# Patient Record
Sex: Male | Born: 1970 | Race: White | Hispanic: No | Marital: Married | Smoking: Never smoker
Health system: Southern US, Community
[De-identification: ages and names within clinical notes are randomized; demographics above are authoritative.]

## PROBLEM LIST (undated history)

## (undated) DIAGNOSIS — R9431 Abnormal electrocardiogram [ECG] [EKG]: Secondary | ICD-10-CM

## (undated) DIAGNOSIS — K219 Gastro-esophageal reflux disease without esophagitis: Secondary | ICD-10-CM

## (undated) DIAGNOSIS — I10 Essential (primary) hypertension: Secondary | ICD-10-CM

## (undated) DIAGNOSIS — R0789 Other chest pain: Secondary | ICD-10-CM

## (undated) HISTORY — PX: CHOLECYSTECTOMY: SHX55

## (undated) HISTORY — DX: Abnormal electrocardiogram (ECG) (EKG): R94.31

## (undated) HISTORY — PX: APPENDECTOMY: SHX54

## (undated) HISTORY — DX: Other chest pain: R07.89

## (undated) HISTORY — DX: Gastro-esophageal reflux disease without esophagitis: K21.9

---

## 2000-02-12 ENCOUNTER — Emergency Department (HOSPITAL_COMMUNITY): Admission: EM | Admit: 2000-02-12 | Discharge: 2000-02-12 | Payer: Self-pay | Admitting: Emergency Medicine

## 2000-04-23 ENCOUNTER — Encounter: Payer: Self-pay | Admitting: Family Medicine

## 2000-04-23 ENCOUNTER — Ambulatory Visit (HOSPITAL_COMMUNITY): Admission: RE | Admit: 2000-04-23 | Discharge: 2000-04-23 | Payer: Self-pay | Admitting: Family Medicine

## 2000-09-10 ENCOUNTER — Encounter: Payer: Self-pay | Admitting: Emergency Medicine

## 2000-09-10 ENCOUNTER — Emergency Department (HOSPITAL_COMMUNITY): Admission: EM | Admit: 2000-09-10 | Discharge: 2000-09-10 | Payer: Self-pay | Admitting: Emergency Medicine

## 2001-09-01 ENCOUNTER — Emergency Department (HOSPITAL_COMMUNITY): Admission: EM | Admit: 2001-09-01 | Discharge: 2001-09-01 | Payer: Self-pay | Admitting: Emergency Medicine

## 2001-09-01 ENCOUNTER — Encounter: Payer: Self-pay | Admitting: Emergency Medicine

## 2004-03-23 ENCOUNTER — Emergency Department (HOSPITAL_COMMUNITY): Admission: EM | Admit: 2004-03-23 | Discharge: 2004-03-24 | Payer: Self-pay | Admitting: Family Medicine

## 2004-11-18 ENCOUNTER — Emergency Department (HOSPITAL_COMMUNITY): Admission: EM | Admit: 2004-11-18 | Discharge: 2004-11-18 | Payer: Self-pay | Admitting: Family Medicine

## 2004-12-09 ENCOUNTER — Emergency Department (HOSPITAL_COMMUNITY): Admission: EM | Admit: 2004-12-09 | Discharge: 2004-12-09 | Payer: Self-pay | Admitting: Emergency Medicine

## 2005-04-10 ENCOUNTER — Emergency Department (HOSPITAL_COMMUNITY): Admission: EM | Admit: 2005-04-10 | Discharge: 2005-04-10 | Payer: Self-pay | Admitting: *Deleted

## 2005-04-15 ENCOUNTER — Emergency Department (HOSPITAL_COMMUNITY): Admission: EM | Admit: 2005-04-15 | Discharge: 2005-04-15 | Payer: Self-pay | Admitting: Emergency Medicine

## 2005-04-28 ENCOUNTER — Encounter (INDEPENDENT_AMBULATORY_CARE_PROVIDER_SITE_OTHER): Payer: Self-pay | Admitting: Specialist

## 2005-04-28 ENCOUNTER — Ambulatory Visit (HOSPITAL_COMMUNITY): Admission: RE | Admit: 2005-04-28 | Discharge: 2005-04-28 | Payer: Self-pay | Admitting: Gastroenterology

## 2006-04-26 ENCOUNTER — Emergency Department (HOSPITAL_COMMUNITY): Admission: EM | Admit: 2006-04-26 | Discharge: 2006-04-26 | Payer: Self-pay | Admitting: Emergency Medicine

## 2006-04-26 ENCOUNTER — Ambulatory Visit: Admission: RE | Admit: 2006-04-26 | Discharge: 2006-04-26 | Payer: Self-pay | Admitting: Family Medicine

## 2006-07-25 ENCOUNTER — Encounter: Admission: RE | Admit: 2006-07-25 | Discharge: 2006-07-25 | Payer: Self-pay | Admitting: Gastroenterology

## 2006-10-12 ENCOUNTER — Encounter: Admission: RE | Admit: 2006-10-12 | Discharge: 2006-10-12 | Payer: Self-pay | Admitting: Gastroenterology

## 2007-01-01 ENCOUNTER — Encounter: Admission: RE | Admit: 2007-01-01 | Discharge: 2007-01-01 | Payer: Self-pay | Admitting: Gastroenterology

## 2007-01-17 ENCOUNTER — Ambulatory Visit (HOSPITAL_COMMUNITY): Admission: RE | Admit: 2007-01-17 | Discharge: 2007-01-17 | Payer: Self-pay | Admitting: Gastroenterology

## 2007-01-23 ENCOUNTER — Encounter: Admission: RE | Admit: 2007-01-23 | Discharge: 2007-01-23 | Payer: Self-pay | Admitting: Gastroenterology

## 2007-03-04 ENCOUNTER — Emergency Department (HOSPITAL_COMMUNITY): Admission: EM | Admit: 2007-03-04 | Discharge: 2007-03-04 | Payer: Self-pay | Admitting: Family Medicine

## 2008-07-29 ENCOUNTER — Emergency Department (HOSPITAL_COMMUNITY): Admission: EM | Admit: 2008-07-29 | Discharge: 2008-07-29 | Payer: Self-pay | Admitting: Family Medicine

## 2008-11-13 ENCOUNTER — Emergency Department (HOSPITAL_COMMUNITY): Admission: EM | Admit: 2008-11-13 | Discharge: 2008-11-13 | Payer: Self-pay | Admitting: Family Medicine

## 2009-04-07 ENCOUNTER — Emergency Department (HOSPITAL_COMMUNITY): Admission: EM | Admit: 2009-04-07 | Discharge: 2009-04-08 | Payer: Self-pay | Admitting: Emergency Medicine

## 2009-04-07 ENCOUNTER — Emergency Department (HOSPITAL_COMMUNITY): Admission: EM | Admit: 2009-04-07 | Discharge: 2009-04-07 | Payer: Self-pay | Admitting: Family Medicine

## 2010-02-23 ENCOUNTER — Emergency Department (HOSPITAL_COMMUNITY): Admission: EM | Admit: 2010-02-23 | Discharge: 2010-02-23 | Payer: Self-pay | Admitting: Emergency Medicine

## 2010-07-18 LAB — POCT URINALYSIS DIP (DEVICE)
Hgb urine dipstick: NEGATIVE
Specific Gravity, Urine: 1.02 (ref 1.005–1.030)
Urobilinogen, UA: 2 mg/dL — ABNORMAL HIGH (ref 0.0–1.0)

## 2010-07-24 LAB — URINE CULTURE: Culture: NO GROWTH

## 2010-07-24 LAB — POCT URINALYSIS DIP (DEVICE)
Bilirubin Urine: NEGATIVE
Protein, ur: NEGATIVE mg/dL
Specific Gravity, Urine: 1.025 (ref 1.005–1.030)
Urobilinogen, UA: 0.2 mg/dL (ref 0.0–1.0)
pH: 6 (ref 5.0–8.0)

## 2011-01-12 ENCOUNTER — Inpatient Hospital Stay (INDEPENDENT_AMBULATORY_CARE_PROVIDER_SITE_OTHER)
Admission: RE | Admit: 2011-01-12 | Discharge: 2011-01-12 | Disposition: A | Discharge status: Home / Self Care | Payer: Managed Care, Other (non HMO) | Source: Ambulatory Visit | Attending: Family Medicine | Admitting: Family Medicine

## 2011-01-12 DIAGNOSIS — M549 Dorsalgia, unspecified: Secondary | ICD-10-CM

## 2011-01-24 LAB — POCT URINALYSIS DIP (DEVICE)
Bilirubin Urine: NEGATIVE
Glucose, UA: NEGATIVE
Hgb urine dipstick: NEGATIVE
Ketones, ur: NEGATIVE
Ketones, ur: NEGATIVE
Leukocytes, UA: NEGATIVE
Nitrite: NEGATIVE
Nitrite: NEGATIVE
Urobilinogen, UA: 0.2
pH: 7

## 2011-01-24 LAB — CBC
HCT: 44.4
Hemoglobin: 15.2
MCHC: 34.1
MCV: 84.9
RBC: 5.23
WBC: 6

## 2011-01-24 LAB — DIFFERENTIAL
Basophils Absolute: 0
Basophils Relative: 0
Eosinophils Absolute: 0.1 — ABNORMAL LOW
Eosinophils Relative: 2
Monocytes Absolute: 0.6

## 2011-01-24 LAB — I-STAT 8, (EC8 V) (CONVERTED LAB)
BUN: 13
Bicarbonate: 30.3 — ABNORMAL HIGH
HCT: 47
Hemoglobin: 16
Operator id: 235561
Sodium: 139
TCO2: 32

## 2011-01-24 LAB — POCT I-STAT CREATININE: Creatinine, Ser: 0.9

## 2014-08-30 ENCOUNTER — Encounter (HOSPITAL_COMMUNITY): Payer: Self-pay | Admitting: *Deleted

## 2014-08-30 ENCOUNTER — Emergency Department (HOSPITAL_COMMUNITY)
Admission: EM | Admit: 2014-08-30 | Discharge: 2014-08-30 | Disposition: A | Discharge status: Home / Self Care | Payer: BLUE CROSS/BLUE SHIELD | Source: Home / Self Care | Attending: Family Medicine | Admitting: Family Medicine

## 2014-08-30 DIAGNOSIS — S76312A Strain of muscle, fascia and tendon of the posterior muscle group at thigh level, left thigh, initial encounter: Secondary | ICD-10-CM | POA: Diagnosis not present

## 2014-08-30 MED ORDER — CYCLOBENZAPRINE HCL 5 MG PO TABS: 5.0000 mg | ORAL_TABLET | Freq: Three times a day (TID) | ORAL | Status: DC | PRN

## 2014-08-30 MED ORDER — DICLOFENAC POTASSIUM 50 MG PO TABS: 50.0000 mg | ORAL_TABLET | Freq: Three times a day (TID) | ORAL | Status: DC

## 2014-08-30 NOTE — Discharge Instructions (Signed)
Use heat, ace wrap and medicine as prescribed and see orthopedist if further problems.

## 2014-08-30 NOTE — ED Notes (Signed)
Pt  Reports  Pain  Behind       l  Leg   Worse   On  Movement and   posistion  denys  specefic  Injury   however pin occurred  While  Walking  in  [employer]

## 2014-08-30 NOTE — ED Provider Notes (Signed)
CSN: [id number]     Arrival date & time 08/30/14  1552 History   First MD Initiated Contact with Patient 08/30/14 1607     Chief Complaint  Patient presents with  . Leg Pain   (Consider location/radiation/quality/duration/timing/severity/associated sxs/prior Treatment) Patient is a 44 y.o. male presenting with leg pain. The history is provided by the patient and the spouse.  Leg Pain Location:  Buttock Time since incident:  2 days Injury: no   Buttock location:  L buttock Pain details:    Quality:  Cramping and sharp   Radiates to:  Does not radiate   Severity:  Mild   Onset quality:  Sudden (walking in [employer] when started.)   Progression:  Unchanged Chronicity:  New Dislocation: no   Prior injury to area:  No Relieved by:  None tried Ineffective treatments:  Acetaminophen and NSAIDs Associated symptoms: no back pain and no fever     History reviewed. No pertinent past medical history. History reviewed. No pertinent past surgical history. History reviewed. No pertinent family history. History  Substance Use Topics  . Smoking status: Never Smoker   . Smokeless tobacco: Not on file  . Alcohol Use: No    Review of Systems  Constitutional: Negative.  Negative for fever.  Gastrointestinal: Negative.   Genitourinary: Negative.   Musculoskeletal: Positive for myalgias. Negative for back pain and gait problem.  Skin: Negative.     Allergies  Review of patient's allergies indicates no known allergies.  Home Medications   Prior to Admission medications   Medication Sig Start Date End Date Taking? Authorizing Provider  cyclobenzaprine (FLEXERIL) 5 MG tablet Take 1 tablet (5 mg total) by mouth 3 (three) times daily as needed for muscle spasms. 08/30/14   Linna HoffJames D Kindl, MD  diclofenac (CATAFLAM) 50 MG tablet Take 1 tablet (50 mg total) by mouth 3 (three) times daily. For leg pain 08/30/14   Linna HoffJames D Kindl, MD   BP 143/85 mmHg  Pulse 111  Temp(Src) 99 F (37.2 C) (Oral)   Resp 16  SpO2 97% Physical Exam  Constitutional: He is oriented to person, place, and time. He appears well-developed and well-nourished.  Musculoskeletal: He exhibits tenderness.       Left upper leg: He exhibits tenderness. He exhibits no bony tenderness, no swelling, no edema and no deformity.       Legs: Neurological: He is alert and oriented to person, place, and time.  Skin: Skin is warm and dry.  Nursing note and vitals reviewed.   ED Course  Procedures (including critical care time) Labs Review Labs Reviewed - No data to display  Imaging Review No results found.   MDM   1. Hamstring muscle strain, left, initial encounter        Linna HoffJames D Kindl, MD 08/30/14 1625

## 2015-07-09 ENCOUNTER — Other Ambulatory Visit: Payer: Self-pay | Admitting: Family Medicine

## 2015-07-09 DIAGNOSIS — R2242 Localized swelling, mass and lump, left lower limb: Secondary | ICD-10-CM

## 2015-07-14 ENCOUNTER — Ambulatory Visit
Admission: RE | Admit: 2015-07-14 | Discharge: 2015-07-14 | Disposition: A | Discharge status: Home / Self Care | Payer: BLUE CROSS/BLUE SHIELD | Source: Ambulatory Visit | Attending: Family Medicine | Admitting: Family Medicine

## 2015-07-14 DIAGNOSIS — R2242 Localized swelling, mass and lump, left lower limb: Secondary | ICD-10-CM

## 2016-07-10 DIAGNOSIS — M5412 Radiculopathy, cervical region: Secondary | ICD-10-CM | POA: Diagnosis not present

## 2016-07-10 DIAGNOSIS — Z13220 Encounter for screening for lipoid disorders: Secondary | ICD-10-CM | POA: Diagnosis not present

## 2016-07-10 DIAGNOSIS — R030 Elevated blood-pressure reading, without diagnosis of hypertension: Secondary | ICD-10-CM | POA: Diagnosis not present

## 2016-07-10 DIAGNOSIS — Z0000 Encounter for general adult medical examination without abnormal findings: Secondary | ICD-10-CM | POA: Diagnosis not present

## 2016-07-10 DIAGNOSIS — L818 Other specified disorders of pigmentation: Secondary | ICD-10-CM | POA: Diagnosis not present

## 2016-07-17 DIAGNOSIS — Z135 Encounter for screening for eye and ear disorders: Secondary | ICD-10-CM | POA: Diagnosis not present

## 2016-09-21 DIAGNOSIS — J029 Acute pharyngitis, unspecified: Secondary | ICD-10-CM | POA: Diagnosis not present

## 2016-09-21 DIAGNOSIS — I10 Essential (primary) hypertension: Secondary | ICD-10-CM | POA: Diagnosis not present

## 2016-10-02 DIAGNOSIS — M5412 Radiculopathy, cervical region: Secondary | ICD-10-CM | POA: Diagnosis not present

## 2016-10-02 DIAGNOSIS — R030 Elevated blood-pressure reading, without diagnosis of hypertension: Secondary | ICD-10-CM | POA: Diagnosis not present

## 2016-10-02 DIAGNOSIS — E669 Obesity, unspecified: Secondary | ICD-10-CM | POA: Diagnosis not present

## 2016-10-02 DIAGNOSIS — L818 Other specified disorders of pigmentation: Secondary | ICD-10-CM | POA: Diagnosis not present

## 2016-11-13 DIAGNOSIS — R05 Cough: Secondary | ICD-10-CM | POA: Diagnosis not present

## 2016-11-13 DIAGNOSIS — M5412 Radiculopathy, cervical region: Secondary | ICD-10-CM | POA: Diagnosis not present

## 2016-11-13 DIAGNOSIS — R030 Elevated blood-pressure reading, without diagnosis of hypertension: Secondary | ICD-10-CM | POA: Diagnosis not present

## 2017-01-12 DIAGNOSIS — I10 Essential (primary) hypertension: Secondary | ICD-10-CM | POA: Diagnosis not present

## 2017-01-12 DIAGNOSIS — R030 Elevated blood-pressure reading, without diagnosis of hypertension: Secondary | ICD-10-CM | POA: Diagnosis not present

## 2017-01-12 DIAGNOSIS — M5412 Radiculopathy, cervical region: Secondary | ICD-10-CM | POA: Diagnosis not present

## 2017-04-27 ENCOUNTER — Emergency Department
Admission: EM | Admit: 2017-04-27 | Discharge: 2017-04-28 | Disposition: A | Discharge status: Home / Self Care | Payer: BLUE CROSS/BLUE SHIELD | Attending: Emergency Medicine | Admitting: Emergency Medicine

## 2017-04-27 ENCOUNTER — Other Ambulatory Visit: Payer: Self-pay

## 2017-04-27 ENCOUNTER — Emergency Department: Payer: BLUE CROSS/BLUE SHIELD

## 2017-04-27 ENCOUNTER — Encounter: Payer: Self-pay | Admitting: Emergency Medicine

## 2017-04-27 DIAGNOSIS — R079 Chest pain, unspecified: Secondary | ICD-10-CM | POA: Diagnosis not present

## 2017-04-27 DIAGNOSIS — I10 Essential (primary) hypertension: Secondary | ICD-10-CM | POA: Diagnosis not present

## 2017-04-27 HISTORY — DX: Essential (primary) hypertension: I10

## 2017-04-27 LAB — BASIC METABOLIC PANEL
ANION GAP: 11 (ref 5–15)
BUN: 15 mg/dL (ref 6–20)
CO2: 25 mmol/L (ref 22–32)
Calcium: 9.2 mg/dL (ref 8.9–10.3)
Chloride: 103 mmol/L (ref 101–111)
Creatinine, Ser: 0.84 mg/dL (ref 0.61–1.24)
GFR calc Af Amer: >60 mL/min (ref >60)
GFR calc non Af Amer: >60 mL/min (ref >60)
Glucose, Bld: 106 mg/dL — ABNORMAL HIGH (ref 65–99)
POTASSIUM: 4.3 mmol/L (ref 3.5–5.1)
SODIUM: 139 mmol/L (ref 135–145)

## 2017-04-27 LAB — CBC
HEMATOCRIT: 45.6 % (ref 40.0–52.0)
Hemoglobin: 15.1 g/dL (ref 13.0–18.0)
MCH: 28.5 pg (ref 26.0–34.0)
MCHC: 33.2 g/dL (ref 32.0–36.0)
MCV: 85.8 fL (ref 80.0–100.0)
Platelets: 241 10*3/uL (ref 150–440)
RBC: 5.31 MIL/uL (ref 4.40–5.90)
RDW: 14 % (ref 11.5–14.5)
WBC: 8.6 10*3/uL (ref 3.8–10.6)

## 2017-04-27 LAB — TROPONIN I: Troponin I: <0.03 ng/mL (ref <0.03)

## 2017-04-27 NOTE — ED Triage Notes (Signed)
Pt arrives ambulatory to triage with c/o chest pain x 2 weeks. Pt was seen at [facility] where he was informed that he was possibly having a HA or a stroke due to his 150/110 BP. Pt is in NAD.

## 2017-04-28 LAB — TROPONIN I: Troponin I: <0.03 ng/mL (ref <0.03)

## 2017-04-28 MED ORDER — KETOROLAC TROMETHAMINE 60 MG/2ML IM SOLN
60.0000 mg | Freq: Once | INTRAMUSCULAR | Status: DC
  Filled 2017-04-28: qty 2

## 2017-04-28 NOTE — ED Provider Notes (Signed)
[facility] Emergency Department Provider Note   ____________________________________________   First MD Initiated Contact with Patient 04/27/17 2355     (approximate)  I have reviewed the triage vital signs and the nursing notes.   HISTORY  Chief Complaint Chest Pain    HPI Ryan Hendricks is a 47 y.o. male who comes into the hospital today with chest pain.  The patient states that it started a couple of weeks ago.  He went to the minute clinic today because he had a cough for a couple of days and they told him that his blood pressure was high and they could not do an EKG so he was sent here.  The patient's blood pressure was 155/110.  He takes blood pressure medicines but he is unsure of the name.  He last saw his doctor about 2 months ago and had his medications changed.  He reports that the pain is in his upper chest across his entire chest.  He rates it about a 3 out of 10 and it comes and goes.  He denies any radiation of the pain although his wife states that he squeezes his hand a lot and she is unsure if that is related.  He states that he always has back pain but denies any shortness of breath dizziness or lightheadedness.  The patient is also not had any nausea or vomiting.  Nothing seems to make the pain better or worse.  The patient is here today for evaluation.  He has not taken anything for pain at home.   Past Medical History:  Diagnosis Date  . Hypertension     There are no active problems to display for this patient.   Past Surgical History:  Procedure Laterality Date  . APPENDECTOMY    . CHOLECYSTECTOMY      Prior to Admission medications   Medication Sig Start Date End Date Taking? Authorizing Provider  cyclobenzaprine (FLEXERIL) 5 MG tablet Take 1 tablet (5 mg total) by mouth 3 (three) times daily as needed for muscle spasms. 08/30/14   Linna Hoff, MD  diclofenac (CATAFLAM) 50 MG tablet Take 1 tablet (50 mg total) by mouth 3  (three) times daily. For leg pain 08/30/14   Linna Hoff, MD    Allergies Patient has no known allergies.  No family history on file.  Social History Social History   Tobacco Use  . Smoking status: Never Smoker  . Smokeless tobacco: Current User    Types: Chew  Substance Use Topics  . Alcohol use: Yes    Comment: occasionally  . Drug use: No    Review of Systems  Constitutional: No fever/chills Eyes: No visual changes. ENT: No sore throat. Cardiovascular: chest pain. Respiratory: Denies shortness of breath. Gastrointestinal: No abdominal pain.  No nausea, no vomiting.  No diarrhea.  No constipation. Genitourinary: Negative for dysuria. Musculoskeletal: Negative for back pain. Skin: Negative for rash. Neurological: Negative for headaches, focal weakness or numbness.   ____________________________________________   PHYSICAL EXAM:  VITAL SIGNS: ED Triage Vitals  Enc Vitals Group     BP 04/27/17 1949 (!) 147/87     Pulse Rate 04/27/17 1949 89     Resp 04/27/17 1949 18     Temp 04/27/17 1949 98.6 F (37 C)     Temp Source 04/27/17 1949 Oral     SpO2 04/27/17 1949 100 %     Weight 04/27/17 1950 238 lb (108 kg)     Height  04/27/17 1950 5\' 11"  (1.803 m)     Head Circumference --      Peak Flow --      Pain Score 04/27/17 1954 3     Pain Loc --      Pain Edu? --      Excl. in GC? --     Constitutional: Alert and oriented. Well appearing and in mild distress. Eyes: Conjunctivae are normal. PERRL. EOMI. Head: Atraumatic. Nose: No congestion/rhinnorhea. Mouth/Throat: Mucous membranes are moist.  Oropharynx non-erythematous. Cardiovascular: Normal rate, regular rhythm. Grossly normal heart sounds.  Good peripheral circulation. Respiratory: Normal respiratory effort.  No retractions. Lungs CTAB.  Mild upper chest tenderness to palpation Gastrointestinal: Soft and nontender. No distention.  Positive bowel sounds Musculoskeletal: No lower extremity tenderness nor  edema.   Neurologic:  Normal speech and language.  Skin:  Skin is warm, dry and intact. Psychiatric: Mood and affect are normal.   ____________________________________________   LABS (all labs ordered are listed, but only abnormal results are displayed)  Labs Reviewed  BASIC METABOLIC PANEL - Abnormal; Notable for the following components:      Result Value   Glucose, Bld 106 (*)    All other components within normal limits  CBC  TROPONIN I  TROPONIN I   ____________________________________________  EKG  ED ECG REPORT I, Rebecka Apley, the attending physician, personally viewed and interpreted this ECG.   Date: 04/27/2017  EKG Time: 1947  Rate: 86  Rhythm: normal sinus rhythm with lots of artifact  Axis: normal  Intervals:none  ST&T Change: none  ED ECG REPORT #2 I, Rebecka Apley, the attending physician, personally viewed and interpreted this ECG.   Date: 04/28/2017  EKG Time: 0036  Rate: 72  Rhythm: normal sinus rhythm  Axis: normal  Intervals:none  ST&T Change: none   ____________________________________________  RADIOLOGY  Dg Chest 2 View  Result Date: 04/27/2017 CLINICAL DATA:  c/o chest pain x 2 weeks. Pt was [facility] at [facility] where he was informed that he was possibly having a HA or a stroke due to his 150/110 BP. EXAM: CHEST  2 VIEW COMPARISON:  11/13/2008 FINDINGS: Mild to moderate thoracic spondylosis. Midline trachea. Normal heart size and mediastinal contours. No pleural effusion or pneumothorax. Clear lungs. IMPRESSION: No active cardiopulmonary disease. Electronically Signed   By: Jeronimo Greaves M.D.   On: 04/27/2017 20:16    ____________________________________________   PROCEDURES  Procedure(s) performed: None  Procedures  Critical Care performed: No  ____________________________________________   INITIAL IMPRESSION / ASSESSMENT AND PLAN / ED COURSE  As part of my medical decision making, I reviewed the  following data within the electronic MEDICAL RECORD NUMBER Notes from prior ED visits and [place] Controlled Substance Database   This is a 47 year old male who comes into the hospital today with some chest pain as well as a cough and elevated blood pressure.  My differential diagnosis includes hypertensive urgency, acute coronary syndrome, musculoskeletal pain, costochondritis.  We did check some blood work on the patient including 2 troponins which were negative.  We also checked a chest x-ray on the patient which was also negative.  I ordered a dose of Toradol for the patient's pain but he said that he did not have any pain and refused it.  The patient's blood pressure was also evaluated in the emergency department and it was 127/75 prior to his discharge.  Since the patient does not have any pain at this time his blood  pressure is improved I feel it is appropriate for the patient to be discharged home.  He should follow-up with his primary care physician and likely will need a stress test for further cardiac evaluation.  Otherwise the patient will be discharged home.      ____________________________________________   FINAL CLINICAL IMPRESSION(S) / ED DIAGNOSES  Final diagnoses:  Chest pain, unspecified type  Hypertension, unspecified type     ED Discharge Orders    None       Note:  This document was prepared using Dragon voice recognition software and may include unintentional dictation errors.    Rebecka ApleyWebster, Allison P, MD 04/28/17 [id number]

## 2017-04-28 NOTE — Discharge Instructions (Signed)
Please follow up with your primary care physician for further evaluation of your chest pain and further management of your blood pressure.

## 2017-05-04 DIAGNOSIS — K210 Gastro-esophageal reflux disease with esophagitis: Secondary | ICD-10-CM | POA: Diagnosis not present

## 2017-05-04 DIAGNOSIS — I10 Essential (primary) hypertension: Secondary | ICD-10-CM | POA: Diagnosis not present

## 2017-05-04 DIAGNOSIS — R0789 Other chest pain: Secondary | ICD-10-CM | POA: Diagnosis not present

## 2017-05-16 ENCOUNTER — Encounter: Payer: Self-pay | Admitting: Interventional Cardiology

## 2017-05-16 ENCOUNTER — Ambulatory Visit (INDEPENDENT_AMBULATORY_CARE_PROVIDER_SITE_OTHER): Payer: BLUE CROSS/BLUE SHIELD | Admitting: Interventional Cardiology

## 2017-05-16 VITALS — BP 140/76 | HR 99 | Ht 72.0 in | Wt 245.4 lb

## 2017-05-16 DIAGNOSIS — R079 Chest pain, unspecified: Secondary | ICD-10-CM

## 2017-05-16 DIAGNOSIS — Z720 Tobacco use: Secondary | ICD-10-CM | POA: Insufficient documentation

## 2017-05-16 DIAGNOSIS — I10 Essential (primary) hypertension: Secondary | ICD-10-CM | POA: Diagnosis not present

## 2017-05-16 NOTE — Progress Notes (Signed)
Cardiology Office Note   Date:  05/16/2017   ID:  Ryan DibbleLarry R Hendricks, DOB 1970/07/28, MRN [id number]  PCP:  Patient, No Pcp Per    No chief complaint on file. chest pain   Wt Readings from Last 3 Encounters:  05/16/17 245 lb 6.4 oz (111.3 kg)  04/27/17 238 lb (108 kg)       History of Present Illness: Ryan Hendricks is a 47 y.o. male who is being seen today for the evaluation of HTN and chest pain at the request of , Dr. Orvan JulyFrances Wong.  A few weeks ago, he had some sharp pain in his chest pain and heart burn feeling.  He went to Urgent care and then to the ER.  He had a negative w/u.  He is here for f/u.  He has had HTN for a year.  Meds have been adjusted and BP has been fairly well controlled. Hoe readings are in the 130-140 systolic range.  Currently on amlodipine, but he dies not remember what else he was on.   He exercises at the gym- treadmill and bike.  No chest pain with exercise.  He does break a sweat and tries to get his hR elevated.  He has some chest pain daily, lasting just a few seconds at a time.    He uses chewing tobacco.    He has had issues with the discs in his neck causing pain.    His job is sedentary.        Past Medical History:  Diagnosis Date  . Abnormal EKG   . Chest pain, atypical   . GERD (gastroesophageal reflux disease)   . Hypertension     Past Surgical History:  Procedure Laterality Date  . APPENDECTOMY    . CHOLECYSTECTOMY       Current Outpatient Medications  Medication Sig Dispense Refill  . amLODipine (NORVASC) 5 MG tablet Take 1 tablet by mouth daily.  4  . pantoprazole (PROTONIX) 40 MG tablet Take 1 tablet by mouth daily.  0   No current facility-administered medications for this visit.     Allergies:   Patient has no known allergies.    Social History:  The patient  reports that  has never smoked. His smokeless tobacco use includes chew. He reports that he drinks alcohol. He reports that he does not use drugs.    Family History:  The patient's family history includes Cancer in his mother; Diabetes in his father; Healthy in his daughter, sister, and sister.    ROS:  Please see the history of present illness.   Otherwise, review of systems are positive for chest pain.   All other systems are reviewed and negative.    PHYSICAL EXAM: VS:  BP 140/76 (BP Location: Left Arm, Patient Position: Sitting, Cuff Size: Large)   Pulse 99   Ht 6' (1.829 m)   Wt 245 lb 6.4 oz (111.3 kg)   SpO2 95%   BMI 33.28 kg/m  , BMI Body mass index is 33.28 kg/m. GEN: Well nourished, well developed, in no acute distress  HEENT: normal  Neck: no JVD, carotid bruits, or masses Cardiac: RRR; no murmurs, rubs, or gallops,no edema  Respiratory:  clear to auscultation bilaterally, normal work of breathing GI: soft, nontender, nondistended, + BS MS: no deformity or atrophy  Skin: warm and dry, no rash Neuro:  Strength and sensation are intact Psych: euthymic mood, full affect   EKG:   The ekg ordered today  demonstrates Normal sinus rhythm, no ST changes   Recent Labs: 04/27/2017: BUN 15; Creatinine, Ser 0.84; Hemoglobin 15.1; Platelets 241; Potassium 4.3; Sodium 139   Lipid Panel No results found for: CHOL, TRIG, HDL, CHOLHDL, VLDL, LDLCALC, LDLDIRECT   Other studies Reviewed: Additional studies/ records that were reviewed today with results demonstrating: LDL 96 in March 2018, HDL 38, cholesterol 158, triglycerides 120.   ASSESSMENT AND PLAN:  1. Chest pain: atypical.  I doubt his chest discomfort is coming from ischemic coronary artery disease.  Given his risk factors including family history of heart disease, will plan for exercise treadmill test.  I suspect he will do fine on this examination. 2. Tobacco abuse: I encouraged him to stop chewing tobacco. 3. Hypertension: Borderline blood pressure control today.  If blood pressure readings remain elevated despite weight loss and increased exercise, could  increase amlodipine to 10 mg daily.   Current medicines are reviewed at length with the patient today.  The patient concerns regarding his medicines were addressed.  The following changes have been made:  No change  Labs/ tests ordered today include:  No orders of the defined types were placed in this encounter.   Recommend 150 minutes/week of aerobic exercise Low fat, low carb, high fiber diet recommended  Disposition:   FU for stress test   Signed, Lance Muss, MD  05/16/2017 4:07 PM    [facility] [address] [place], [place], [place]  [address] Phone: [phone]; Fax: [phone]

## 2017-05-16 NOTE — Patient Instructions (Signed)
Medication Instructions:  Your physician recommends that you continue on your current medications as directed. Please refer to the Current Medication list given to you today.   Labwork: None ordered  Testing/Procedures: Your physician has requested that you have an exercise tolerance test. For further information please visit [email or web address]. Please also follow instruction sheet, as given.  Follow-Up: Your physician wants you to follow-up AS NEEDED   Any Other Special Instructions Will Be Listed Below (If Applicable).    Exercise Stress Electrocardiogram An exercise stress electrocardiogram is a test to check how blood flows to your heart. It is done to find areas of poor blood flow. You will need to walk on a treadmill for this test. The electrocardiogram will record your heartbeat when you are at rest and when you are exercising. What happens before the procedure?  Do not have drinks with caffeine or foods with caffeine for 24 hours before the test, or as told by your doctor. This includes coffee, tea (even decaf tea), sodas, chocolate, and cocoa.  Follow your doctor's instructions about eating and drinking before the test.  Ask your doctor what medicines you should or should not take before the test. Take your medicines with water unless told by your doctor not to.  If you use an inhaler, bring it with you to the test.  Bring a snack to eat after the test.  Do not  smoke for 4 hours before the test.  Do not put lotions, powders, creams, or oils on your chest before the test.  Wear comfortable shoes and clothing. What happens during the procedure?  You will have patches put on your chest. Small areas of your chest may need to be shaved. Wires will be connected to the patches.  Your heart rate will be watched while you are resting and while you are exercising.  You will walk on the treadmill. The treadmill will slowly get faster to raise your heart rate.  The test  will take about 1-2 hours. What happens after the procedure?  Your heart rate and blood pressure will be watched after the test.  You may return to your normal diet, activities, and medicines or as told by your doctor. This information is not intended to replace advice given to you by your health care provider. Make sure you discuss any questions you have with your health care provider. Document Released: 09/20/2007 Document Revised: 12/01/2015 Document Reviewed: 12/09/2012 Elsevier Interactive Patient Education  [employer]2018 [employer]   If you need a refill on your cardiac medications before your next appointment, please call your pharmacy.

## 2017-05-28 ENCOUNTER — Other Ambulatory Visit: Payer: Self-pay | Admitting: Family Medicine

## 2017-05-28 ENCOUNTER — Ambulatory Visit
Admission: RE | Admit: 2017-05-28 | Discharge: 2017-05-28 | Disposition: A | Discharge status: Home / Self Care | Payer: BLUE CROSS/BLUE SHIELD | Source: Ambulatory Visit | Attending: Family Medicine | Admitting: Family Medicine

## 2017-05-28 DIAGNOSIS — R0789 Other chest pain: Secondary | ICD-10-CM | POA: Diagnosis not present

## 2017-05-28 DIAGNOSIS — R0781 Pleurodynia: Secondary | ICD-10-CM

## 2017-05-28 DIAGNOSIS — I10 Essential (primary) hypertension: Secondary | ICD-10-CM | POA: Diagnosis not present

## 2017-05-28 DIAGNOSIS — R9431 Abnormal electrocardiogram [ECG] [EKG]: Secondary | ICD-10-CM | POA: Diagnosis not present

## 2017-05-28 DIAGNOSIS — K210 Gastro-esophageal reflux disease with esophagitis: Secondary | ICD-10-CM | POA: Diagnosis not present

## 2017-06-06 ENCOUNTER — Ambulatory Visit (INDEPENDENT_AMBULATORY_CARE_PROVIDER_SITE_OTHER): Payer: BLUE CROSS/BLUE SHIELD

## 2017-06-06 DIAGNOSIS — R079 Chest pain, unspecified: Secondary | ICD-10-CM | POA: Diagnosis not present

## 2017-06-06 LAB — EXERCISE TOLERANCE TEST
CHL RATE OF PERCEIVED EXERTION: 17
CSEPED: 8 min
Estimated workload: 10.1 METS
Exercise duration (sec): 1 s
MPHR: 175 {beats}/min
Peak HR: 155 {beats}/min
Percent HR: 88 %
Rest HR: 74 {beats}/min

## 2017-06-14 DIAGNOSIS — R9431 Abnormal electrocardiogram [ECG] [EKG]: Secondary | ICD-10-CM | POA: Diagnosis not present

## 2017-06-14 DIAGNOSIS — I10 Essential (primary) hypertension: Secondary | ICD-10-CM | POA: Diagnosis not present

## 2017-06-14 DIAGNOSIS — R0789 Other chest pain: Secondary | ICD-10-CM | POA: Diagnosis not present

## 2017-06-14 DIAGNOSIS — K210 Gastro-esophageal reflux disease with esophagitis: Secondary | ICD-10-CM | POA: Diagnosis not present

## 2017-10-16 DIAGNOSIS — Z79899 Other long term (current) drug therapy: Secondary | ICD-10-CM | POA: Diagnosis not present

## 2017-10-16 DIAGNOSIS — I10 Essential (primary) hypertension: Secondary | ICD-10-CM | POA: Diagnosis not present

## 2017-10-16 DIAGNOSIS — K210 Gastro-esophageal reflux disease with esophagitis: Secondary | ICD-10-CM | POA: Diagnosis not present

## 2018-04-18 DIAGNOSIS — K210 Gastro-esophageal reflux disease with esophagitis: Secondary | ICD-10-CM | POA: Diagnosis not present

## 2018-04-18 DIAGNOSIS — I10 Essential (primary) hypertension: Secondary | ICD-10-CM | POA: Diagnosis not present

## 2018-04-18 DIAGNOSIS — R739 Hyperglycemia, unspecified: Secondary | ICD-10-CM | POA: Diagnosis not present

## 2018-04-18 DIAGNOSIS — Z79899 Other long term (current) drug therapy: Secondary | ICD-10-CM | POA: Diagnosis not present

## 2018-07-25 DIAGNOSIS — R739 Hyperglycemia, unspecified: Secondary | ICD-10-CM | POA: Diagnosis not present

## 2018-07-25 DIAGNOSIS — Z136 Encounter for screening for cardiovascular disorders: Secondary | ICD-10-CM | POA: Diagnosis not present

## 2018-07-25 DIAGNOSIS — Z79899 Other long term (current) drug therapy: Secondary | ICD-10-CM | POA: Diagnosis not present

## 2018-07-25 DIAGNOSIS — I10 Essential (primary) hypertension: Secondary | ICD-10-CM | POA: Diagnosis not present

## 2018-07-25 DIAGNOSIS — K210 Gastro-esophageal reflux disease with esophagitis: Secondary | ICD-10-CM | POA: Diagnosis not present

## 2018-08-28 DIAGNOSIS — R197 Diarrhea, unspecified: Secondary | ICD-10-CM | POA: Diagnosis not present

## 2018-08-28 DIAGNOSIS — R110 Nausea: Secondary | ICD-10-CM | POA: Diagnosis not present

## 2018-09-21 IMAGING — CR DG CHEST 2V
2 series · 2 of 2 positions shown · non-contrast
Comparison: 11/13/2008

CLINICAL DATA: c/o chest pain x 2 weeks. Pt was seen at [facility] where he was informed that he was possibly having a HA
or a stroke due to his 150/110 BP.

EXAM:
CHEST  2 VIEW

[chest pa]
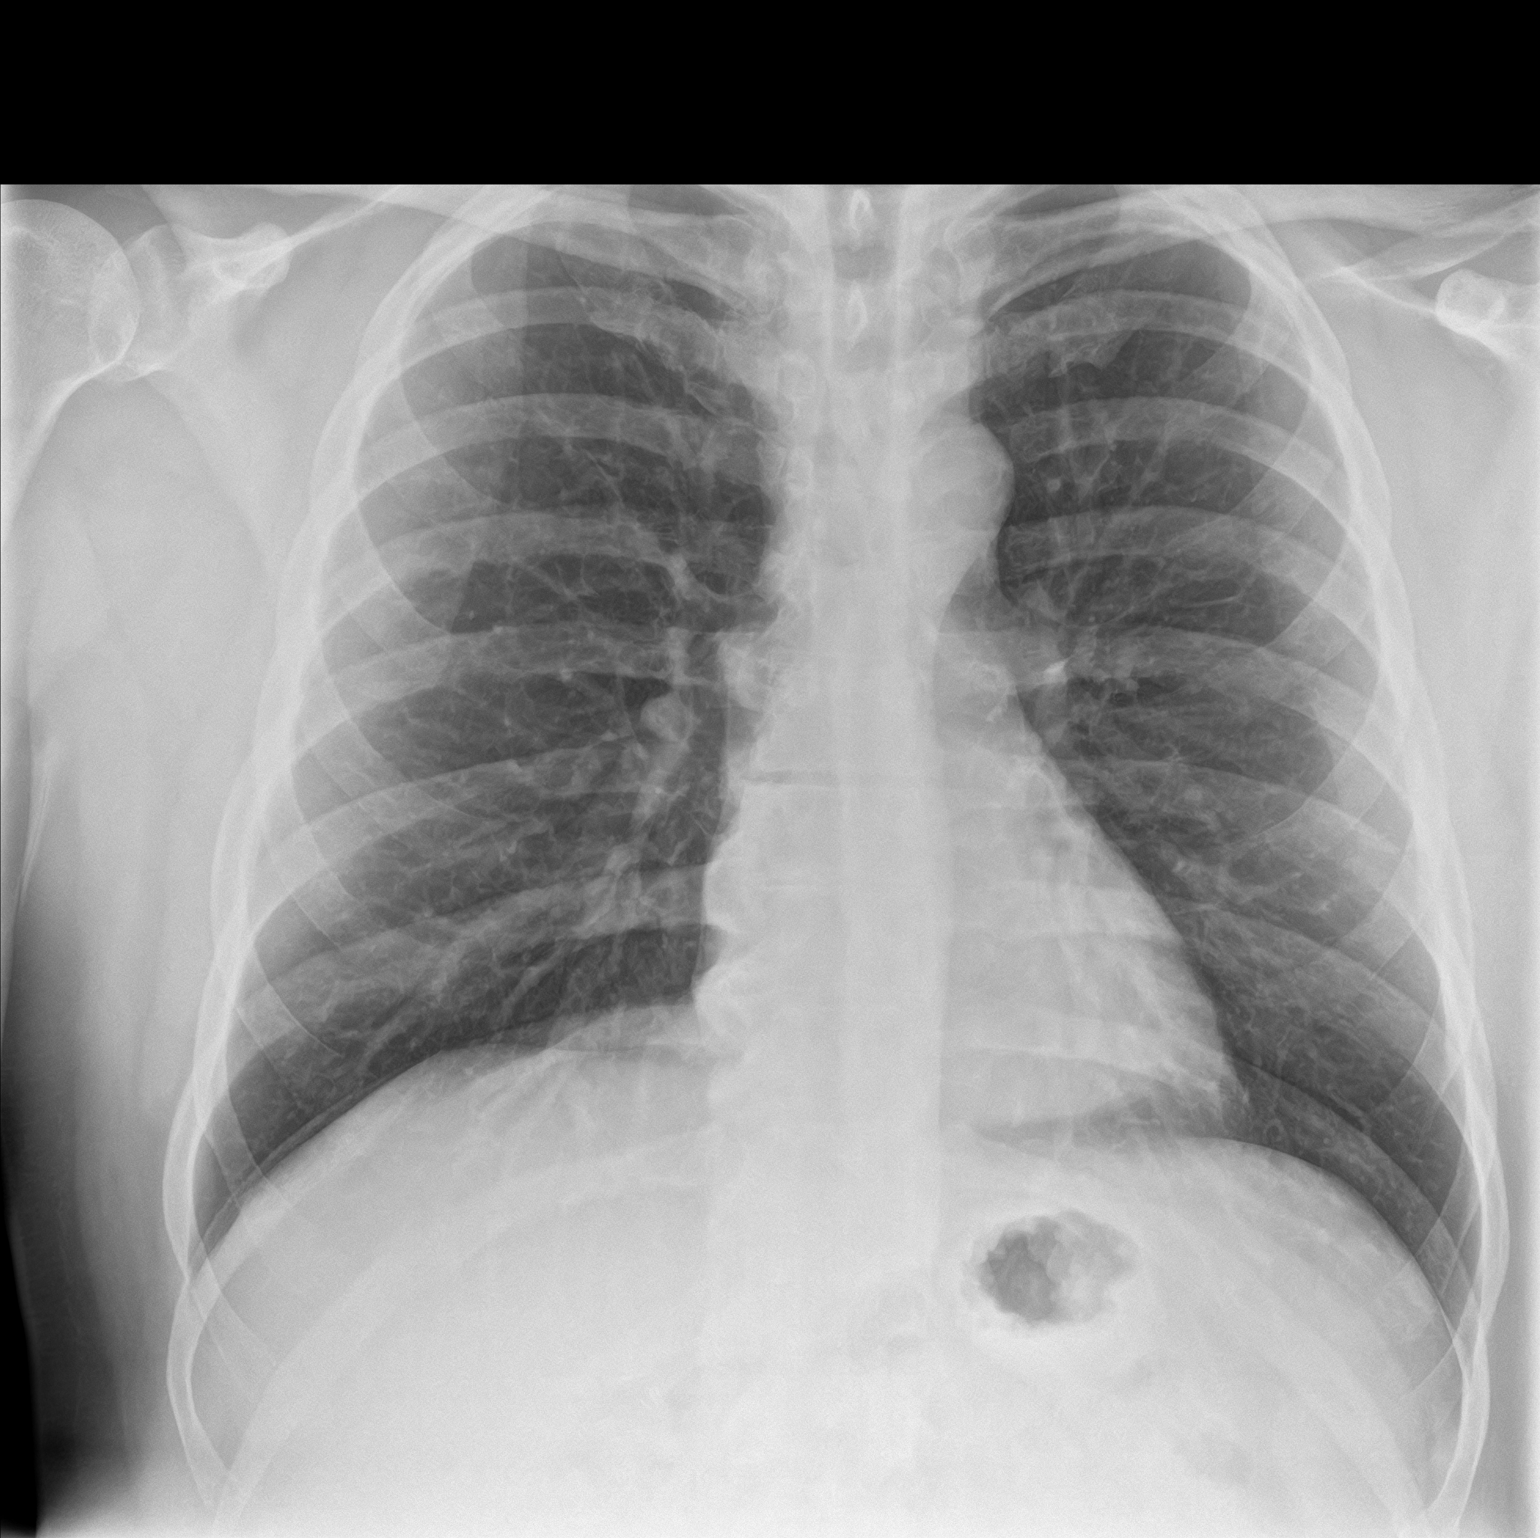

[chest lat]
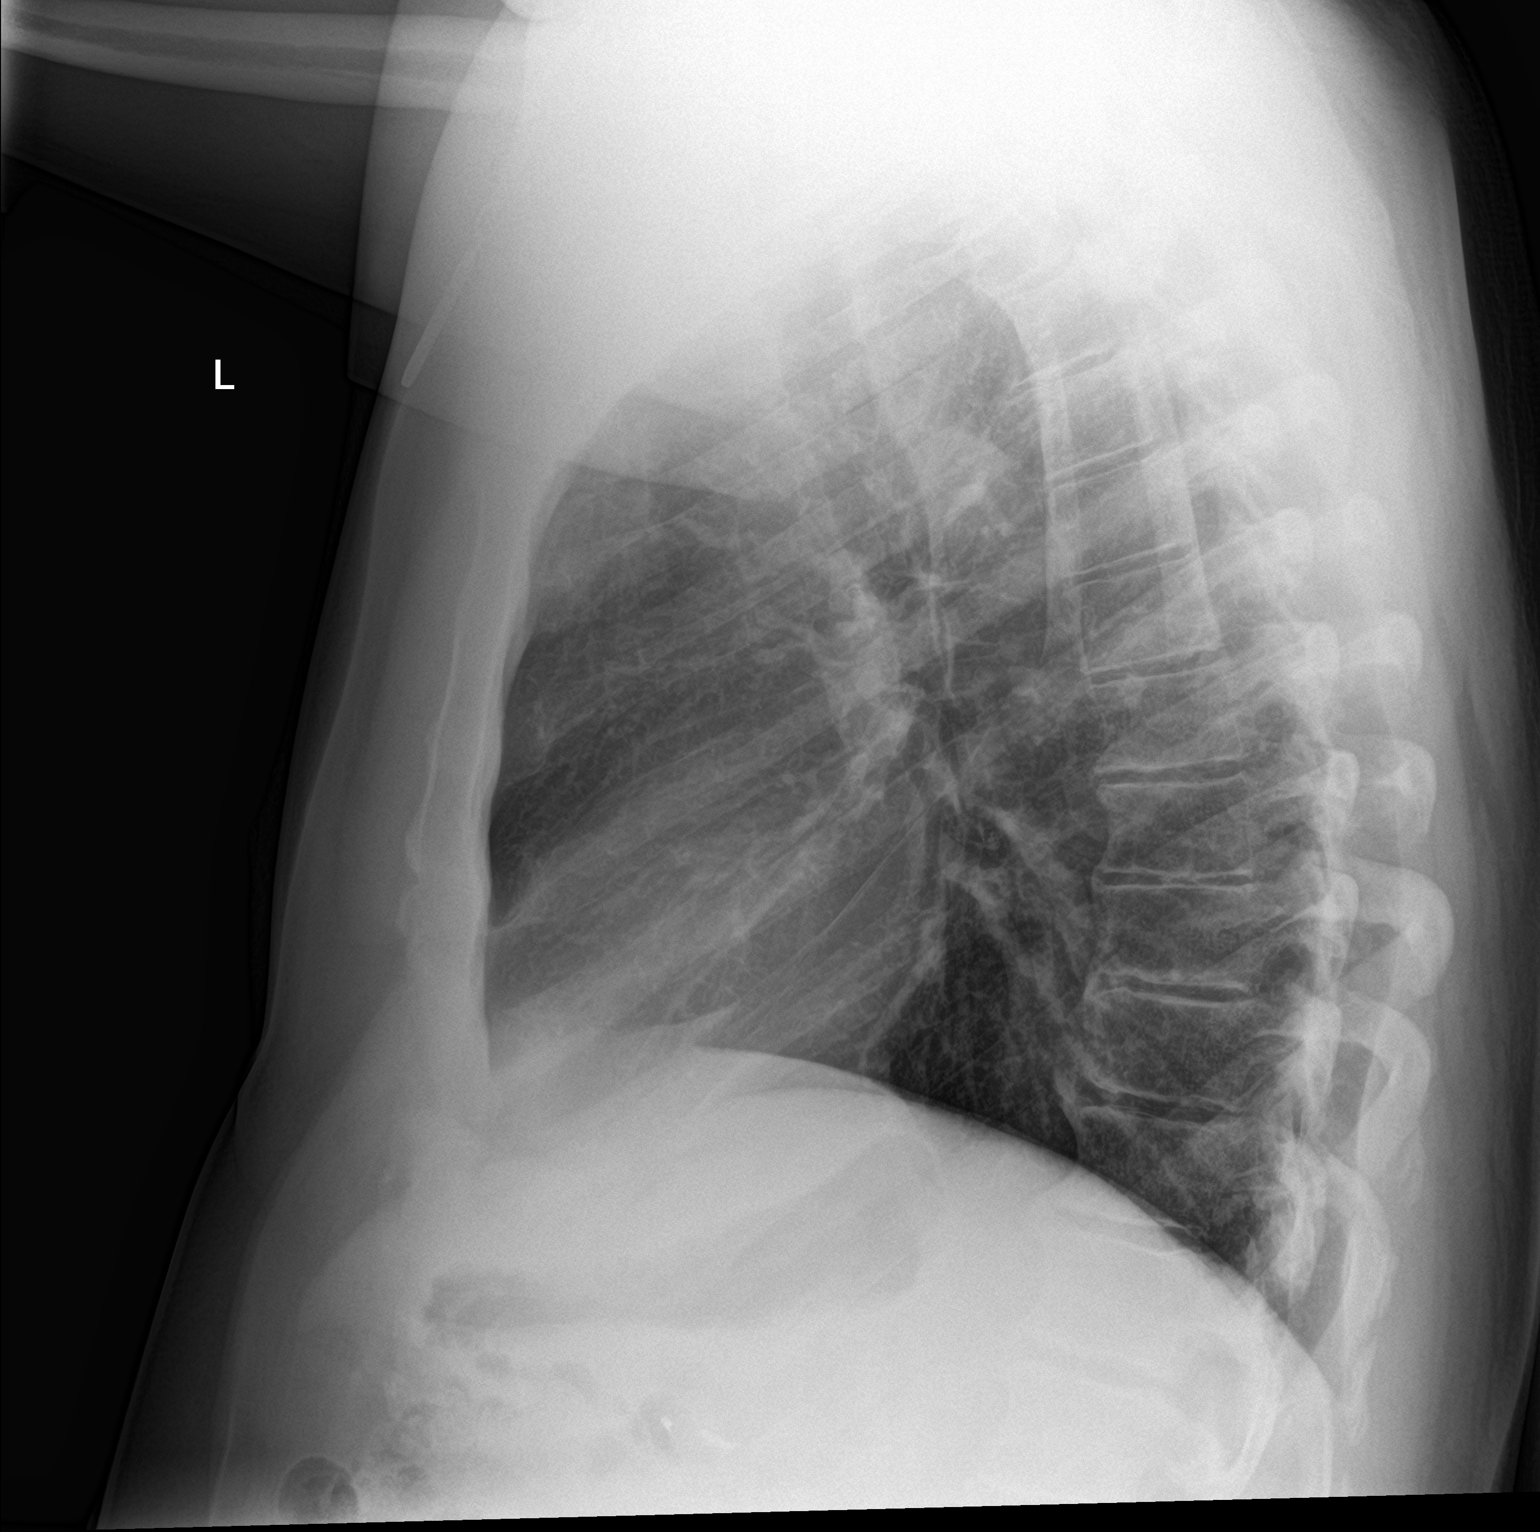

[2 of 2 positions shown; findings below may reference images not displayed]

FINDINGS: Mild to moderate thoracic spondylosis. Midline trachea. Normal heart
size and mediastinal contours. No pleural effusion or pneumothorax.
Clear lungs.
IMPRESSION: No active cardiopulmonary disease.

## 2018-10-22 IMAGING — CR DG RIBS W/ CHEST 3+V*L*
3 series · 3 of 3 positions shown · non-contrast
Comparison: Chest x-ray 04/27/2017

CLINICAL DATA: Left anterior and lower rib pain for 1 week with no
known trauma.

EXAM:
LEFT RIBS AND CHEST - 3+ VIEW

[w chest pa]
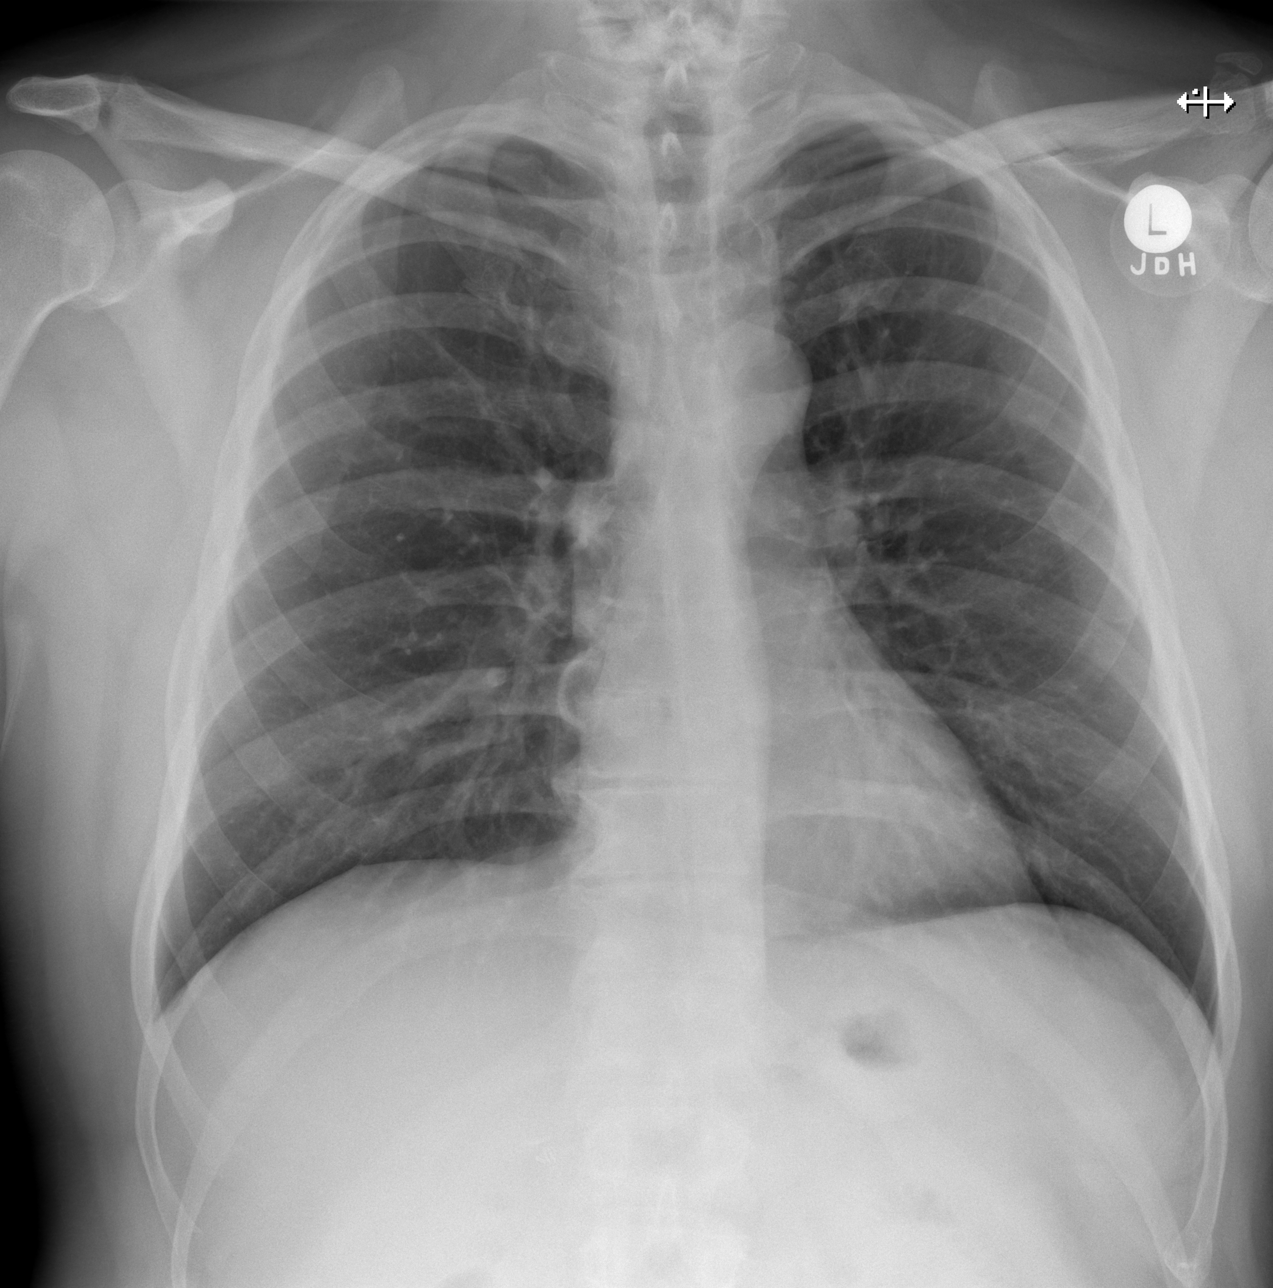

[w ribs ap lower left]
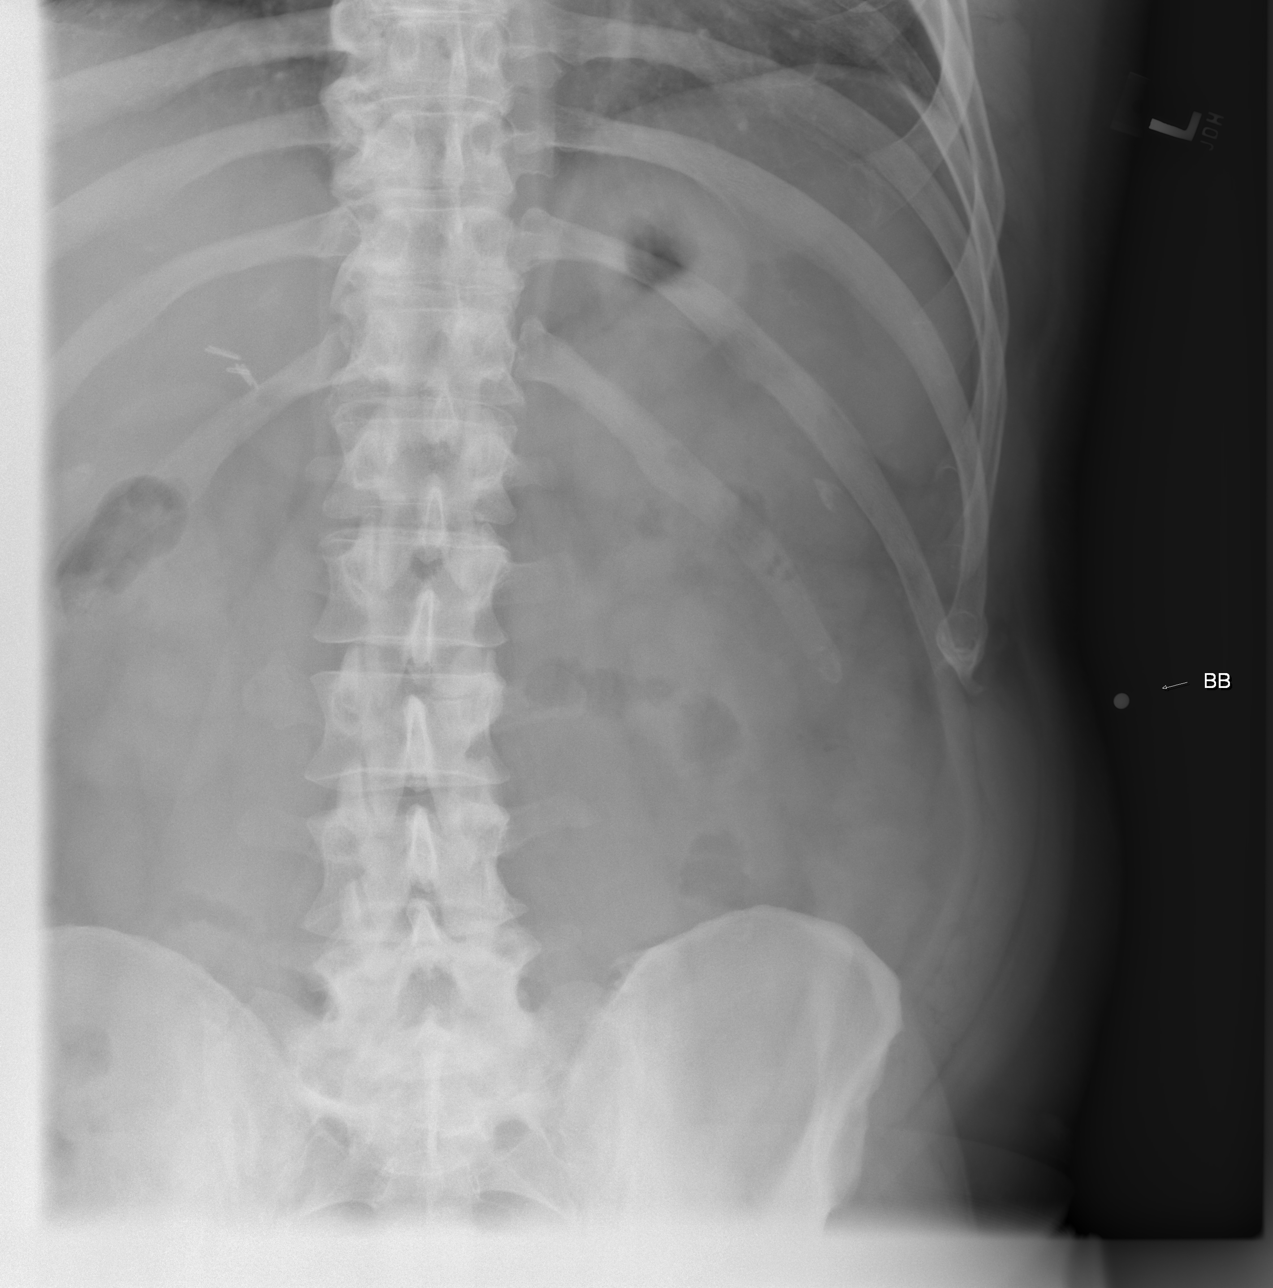

[w ribs obl left]
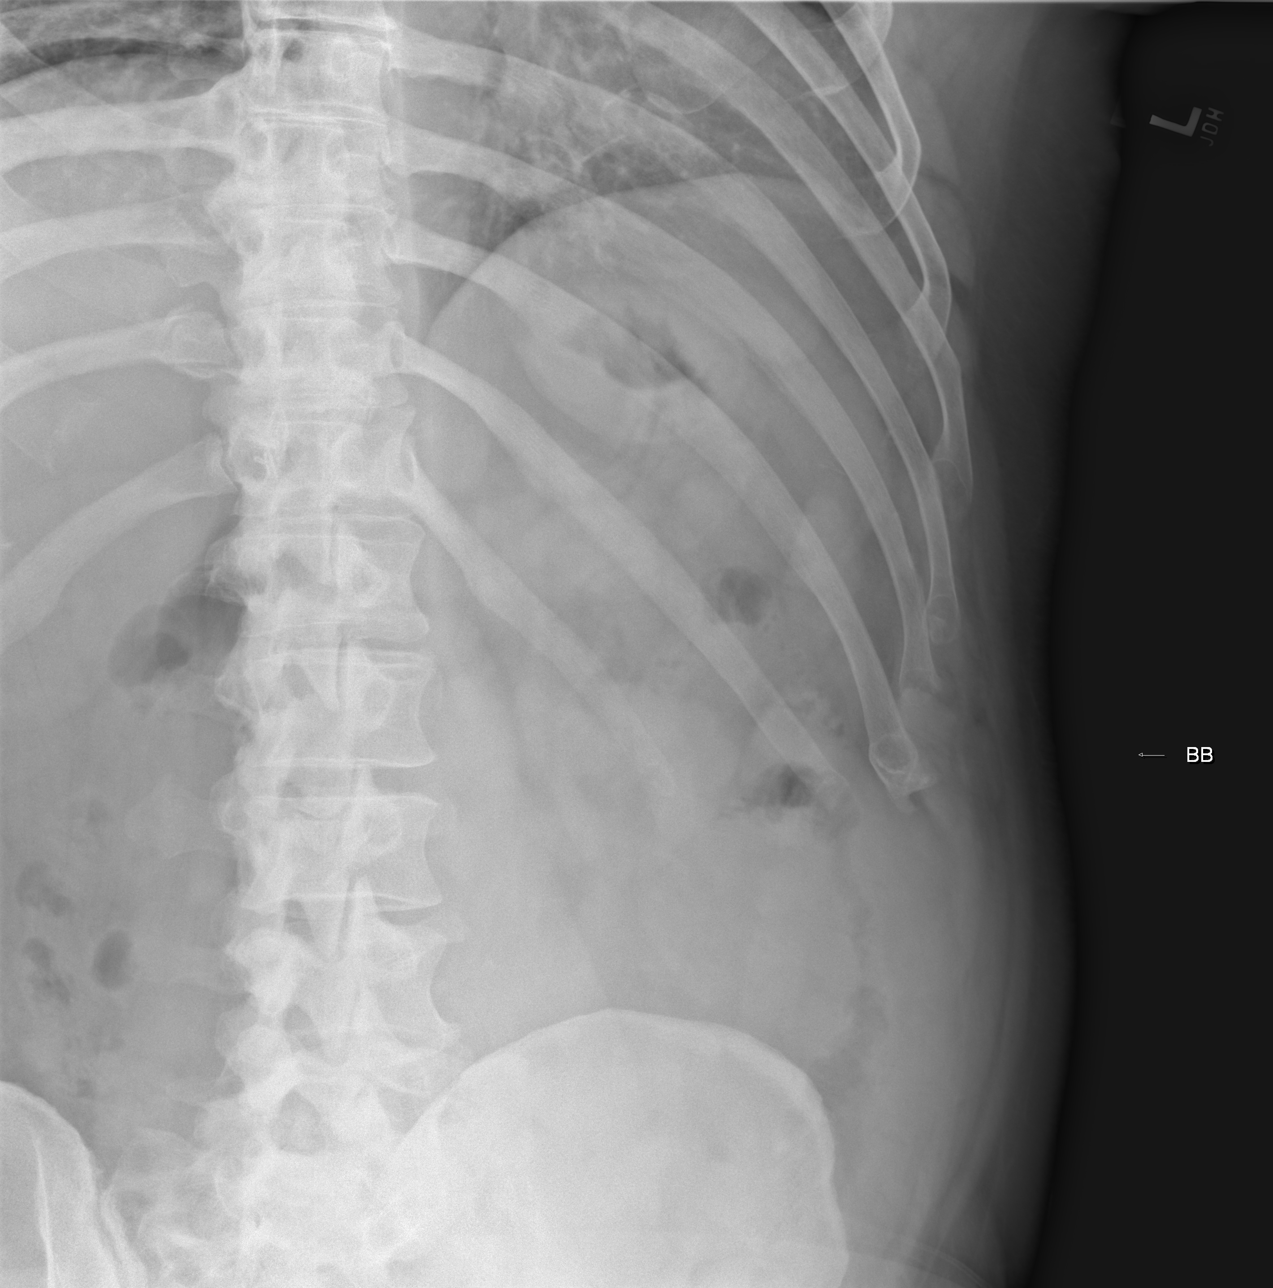

[3 of 3 positions shown; findings below may reference images not displayed]

FINDINGS: Focused images of the symptomatic left lower ribs show no fracture
or bone lesion. There is degenerative spurring in the thoracic
spine. The lungs are clear and well aerated. Normal
cardiomediastinal contours. Presumed cholecystectomy clips.
IMPRESSION: Negative chest and left rib series.

## 2019-01-24 DIAGNOSIS — K219 Gastro-esophageal reflux disease without esophagitis: Secondary | ICD-10-CM | POA: Diagnosis not present

## 2019-01-24 DIAGNOSIS — I10 Essential (primary) hypertension: Secondary | ICD-10-CM | POA: Diagnosis not present

## 2019-01-24 DIAGNOSIS — R739 Hyperglycemia, unspecified: Secondary | ICD-10-CM | POA: Diagnosis not present

## 2019-01-24 DIAGNOSIS — N522 Drug-induced erectile dysfunction: Secondary | ICD-10-CM | POA: Diagnosis not present

## 2019-03-25 DIAGNOSIS — M7631 Iliotibial band syndrome, right leg: Secondary | ICD-10-CM | POA: Diagnosis not present

## 2019-03-25 DIAGNOSIS — M25561 Pain in right knee: Secondary | ICD-10-CM | POA: Diagnosis not present

## 2019-07-25 DIAGNOSIS — I10 Essential (primary) hypertension: Secondary | ICD-10-CM | POA: Diagnosis not present

## 2019-07-25 DIAGNOSIS — Z13220 Encounter for screening for lipoid disorders: Secondary | ICD-10-CM | POA: Diagnosis not present

## 2019-07-25 DIAGNOSIS — R739 Hyperglycemia, unspecified: Secondary | ICD-10-CM | POA: Diagnosis not present

## 2019-07-25 DIAGNOSIS — Z0000 Encounter for general adult medical examination without abnormal findings: Secondary | ICD-10-CM | POA: Diagnosis not present

## 2019-07-25 DIAGNOSIS — Z79899 Other long term (current) drug therapy: Secondary | ICD-10-CM | POA: Diagnosis not present

## 2020-06-03 DIAGNOSIS — I10 Essential (primary) hypertension: Secondary | ICD-10-CM | POA: Diagnosis not present

## 2020-06-03 DIAGNOSIS — Z79899 Other long term (current) drug therapy: Secondary | ICD-10-CM | POA: Diagnosis not present

## 2020-06-03 DIAGNOSIS — R739 Hyperglycemia, unspecified: Secondary | ICD-10-CM | POA: Diagnosis not present

## 2020-06-03 DIAGNOSIS — K219 Gastro-esophageal reflux disease without esophagitis: Secondary | ICD-10-CM | POA: Diagnosis not present

## 2020-06-03 DIAGNOSIS — N522 Drug-induced erectile dysfunction: Secondary | ICD-10-CM | POA: Diagnosis not present

## 2020-11-30 DIAGNOSIS — I10 Essential (primary) hypertension: Secondary | ICD-10-CM | POA: Diagnosis not present

## 2020-11-30 DIAGNOSIS — K219 Gastro-esophageal reflux disease without esophagitis: Secondary | ICD-10-CM | POA: Diagnosis not present

## 2020-11-30 DIAGNOSIS — R739 Hyperglycemia, unspecified: Secondary | ICD-10-CM | POA: Diagnosis not present

## 2020-11-30 DIAGNOSIS — N522 Drug-induced erectile dysfunction: Secondary | ICD-10-CM | POA: Diagnosis not present

## 2021-08-04 DIAGNOSIS — R2242 Localized swelling, mass and lump, left lower limb: Secondary | ICD-10-CM | POA: Diagnosis not present

## 2021-08-08 DIAGNOSIS — R2242 Localized swelling, mass and lump, left lower limb: Secondary | ICD-10-CM | POA: Diagnosis not present

## 2021-11-22 DIAGNOSIS — K2100 Gastro-esophageal reflux disease with esophagitis, without bleeding: Secondary | ICD-10-CM | POA: Diagnosis not present

## 2021-11-22 DIAGNOSIS — R7303 Prediabetes: Secondary | ICD-10-CM | POA: Diagnosis not present

## 2021-11-22 DIAGNOSIS — I10 Essential (primary) hypertension: Secondary | ICD-10-CM | POA: Diagnosis not present

## 2022-01-23 DIAGNOSIS — M7711 Lateral epicondylitis, right elbow: Secondary | ICD-10-CM | POA: Diagnosis not present

## 2022-05-30 DIAGNOSIS — Z125 Encounter for screening for malignant neoplasm of prostate: Secondary | ICD-10-CM | POA: Diagnosis not present

## 2022-05-30 DIAGNOSIS — I951 Orthostatic hypotension: Secondary | ICD-10-CM | POA: Diagnosis not present

## 2022-05-30 DIAGNOSIS — Z0000 Encounter for general adult medical examination without abnormal findings: Secondary | ICD-10-CM | POA: Diagnosis not present

## 2022-05-30 DIAGNOSIS — I10 Essential (primary) hypertension: Secondary | ICD-10-CM | POA: Diagnosis not present

## 2022-05-30 DIAGNOSIS — R7303 Prediabetes: Secondary | ICD-10-CM | POA: Diagnosis not present

## 2022-05-30 DIAGNOSIS — E7800 Pure hypercholesterolemia, unspecified: Secondary | ICD-10-CM | POA: Diagnosis not present

## 2022-09-05 DIAGNOSIS — M7711 Lateral epicondylitis, right elbow: Secondary | ICD-10-CM | POA: Diagnosis not present

## 2022-09-05 DIAGNOSIS — M67911 Unspecified disorder of synovium and tendon, right shoulder: Secondary | ICD-10-CM | POA: Diagnosis not present

## 2022-09-05 DIAGNOSIS — M25511 Pain in right shoulder: Secondary | ICD-10-CM | POA: Diagnosis not present

## 2022-09-18 DIAGNOSIS — M67911 Unspecified disorder of synovium and tendon, right shoulder: Secondary | ICD-10-CM | POA: Diagnosis not present

## 2022-10-24 DIAGNOSIS — M67911 Unspecified disorder of synovium and tendon, right shoulder: Secondary | ICD-10-CM | POA: Diagnosis not present

## 2022-11-14 DIAGNOSIS — M25511 Pain in right shoulder: Secondary | ICD-10-CM | POA: Diagnosis not present

## 2022-11-14 DIAGNOSIS — M67911 Unspecified disorder of synovium and tendon, right shoulder: Secondary | ICD-10-CM | POA: Diagnosis not present

## 2022-11-29 DIAGNOSIS — R7303 Prediabetes: Secondary | ICD-10-CM | POA: Diagnosis not present

## 2022-11-29 DIAGNOSIS — I10 Essential (primary) hypertension: Secondary | ICD-10-CM | POA: Diagnosis not present

## 2022-11-29 DIAGNOSIS — K2100 Gastro-esophageal reflux disease with esophagitis, without bleeding: Secondary | ICD-10-CM | POA: Diagnosis not present

## 2022-11-29 DIAGNOSIS — N522 Drug-induced erectile dysfunction: Secondary | ICD-10-CM | POA: Diagnosis not present

## 2022-12-06 DIAGNOSIS — X58XXXA Exposure to other specified factors, initial encounter: Secondary | ICD-10-CM | POA: Diagnosis not present

## 2022-12-06 DIAGNOSIS — M19011 Primary osteoarthritis, right shoulder: Secondary | ICD-10-CM | POA: Diagnosis not present

## 2022-12-06 DIAGNOSIS — Y999 Unspecified external cause status: Secondary | ICD-10-CM | POA: Diagnosis not present

## 2022-12-06 DIAGNOSIS — M94211 Chondromalacia, right shoulder: Secondary | ICD-10-CM | POA: Diagnosis not present

## 2022-12-06 DIAGNOSIS — M7522 Bicipital tendinitis, left shoulder: Secondary | ICD-10-CM | POA: Diagnosis not present

## 2022-12-06 DIAGNOSIS — M7541 Impingement syndrome of right shoulder: Secondary | ICD-10-CM | POA: Diagnosis not present

## 2022-12-06 DIAGNOSIS — G8918 Other acute postprocedural pain: Secondary | ICD-10-CM | POA: Diagnosis not present

## 2022-12-06 DIAGNOSIS — S46011A Strain of muscle(s) and tendon(s) of the rotator cuff of right shoulder, initial encounter: Secondary | ICD-10-CM | POA: Diagnosis not present

## 2022-12-06 DIAGNOSIS — S43431A Superior glenoid labrum lesion of right shoulder, initial encounter: Secondary | ICD-10-CM | POA: Diagnosis not present

## 2022-12-12 DIAGNOSIS — R531 Weakness: Secondary | ICD-10-CM | POA: Diagnosis not present

## 2022-12-12 DIAGNOSIS — M75111 Incomplete rotator cuff tear or rupture of right shoulder, not specified as traumatic: Secondary | ICD-10-CM | POA: Diagnosis not present

## 2022-12-14 DIAGNOSIS — Z9889 Other specified postprocedural states: Secondary | ICD-10-CM | POA: Diagnosis not present

## 2022-12-20 DIAGNOSIS — M75111 Incomplete rotator cuff tear or rupture of right shoulder, not specified as traumatic: Secondary | ICD-10-CM | POA: Diagnosis not present

## 2022-12-20 DIAGNOSIS — R531 Weakness: Secondary | ICD-10-CM | POA: Diagnosis not present

## 2022-12-26 DIAGNOSIS — R531 Weakness: Secondary | ICD-10-CM | POA: Diagnosis not present

## 2022-12-26 DIAGNOSIS — M75111 Incomplete rotator cuff tear or rupture of right shoulder, not specified as traumatic: Secondary | ICD-10-CM | POA: Diagnosis not present

## 2022-12-28 DIAGNOSIS — R531 Weakness: Secondary | ICD-10-CM | POA: Diagnosis not present

## 2022-12-28 DIAGNOSIS — M75111 Incomplete rotator cuff tear or rupture of right shoulder, not specified as traumatic: Secondary | ICD-10-CM | POA: Diagnosis not present

## 2023-01-02 DIAGNOSIS — R531 Weakness: Secondary | ICD-10-CM | POA: Diagnosis not present

## 2023-01-02 DIAGNOSIS — M75111 Incomplete rotator cuff tear or rupture of right shoulder, not specified as traumatic: Secondary | ICD-10-CM | POA: Diagnosis not present

## 2023-01-09 DIAGNOSIS — R531 Weakness: Secondary | ICD-10-CM | POA: Diagnosis not present

## 2023-01-09 DIAGNOSIS — M75111 Incomplete rotator cuff tear or rupture of right shoulder, not specified as traumatic: Secondary | ICD-10-CM | POA: Diagnosis not present

## 2023-01-11 DIAGNOSIS — R531 Weakness: Secondary | ICD-10-CM | POA: Diagnosis not present

## 2023-01-11 DIAGNOSIS — M75111 Incomplete rotator cuff tear or rupture of right shoulder, not specified as traumatic: Secondary | ICD-10-CM | POA: Diagnosis not present

## 2023-01-16 DIAGNOSIS — M75111 Incomplete rotator cuff tear or rupture of right shoulder, not specified as traumatic: Secondary | ICD-10-CM | POA: Diagnosis not present

## 2023-01-16 DIAGNOSIS — R531 Weakness: Secondary | ICD-10-CM | POA: Diagnosis not present

## 2023-01-18 DIAGNOSIS — M75111 Incomplete rotator cuff tear or rupture of right shoulder, not specified as traumatic: Secondary | ICD-10-CM | POA: Diagnosis not present

## 2023-01-18 DIAGNOSIS — R531 Weakness: Secondary | ICD-10-CM | POA: Diagnosis not present

## 2023-01-23 DIAGNOSIS — R531 Weakness: Secondary | ICD-10-CM | POA: Diagnosis not present

## 2023-01-23 DIAGNOSIS — M75111 Incomplete rotator cuff tear or rupture of right shoulder, not specified as traumatic: Secondary | ICD-10-CM | POA: Diagnosis not present

## 2023-01-30 DIAGNOSIS — M75111 Incomplete rotator cuff tear or rupture of right shoulder, not specified as traumatic: Secondary | ICD-10-CM | POA: Diagnosis not present

## 2023-01-30 DIAGNOSIS — R531 Weakness: Secondary | ICD-10-CM | POA: Diagnosis not present

## 2023-02-06 DIAGNOSIS — R531 Weakness: Secondary | ICD-10-CM | POA: Diagnosis not present

## 2023-02-06 DIAGNOSIS — M75111 Incomplete rotator cuff tear or rupture of right shoulder, not specified as traumatic: Secondary | ICD-10-CM | POA: Diagnosis not present

## 2023-02-08 DIAGNOSIS — M75111 Incomplete rotator cuff tear or rupture of right shoulder, not specified as traumatic: Secondary | ICD-10-CM | POA: Diagnosis not present

## 2023-02-08 DIAGNOSIS — R531 Weakness: Secondary | ICD-10-CM | POA: Diagnosis not present

## 2023-02-20 DIAGNOSIS — M75111 Incomplete rotator cuff tear or rupture of right shoulder, not specified as traumatic: Secondary | ICD-10-CM | POA: Diagnosis not present

## 2023-02-20 DIAGNOSIS — R531 Weakness: Secondary | ICD-10-CM | POA: Diagnosis not present

## 2023-02-22 DIAGNOSIS — M75111 Incomplete rotator cuff tear or rupture of right shoulder, not specified as traumatic: Secondary | ICD-10-CM | POA: Diagnosis not present

## 2023-02-22 DIAGNOSIS — R531 Weakness: Secondary | ICD-10-CM | POA: Diagnosis not present

## 2023-02-27 DIAGNOSIS — R531 Weakness: Secondary | ICD-10-CM | POA: Diagnosis not present

## 2023-02-27 DIAGNOSIS — M75111 Incomplete rotator cuff tear or rupture of right shoulder, not specified as traumatic: Secondary | ICD-10-CM | POA: Diagnosis not present

## 2023-03-06 DIAGNOSIS — M75111 Incomplete rotator cuff tear or rupture of right shoulder, not specified as traumatic: Secondary | ICD-10-CM | POA: Diagnosis not present

## 2023-03-06 DIAGNOSIS — R531 Weakness: Secondary | ICD-10-CM | POA: Diagnosis not present

## 2023-03-14 DIAGNOSIS — M75111 Incomplete rotator cuff tear or rupture of right shoulder, not specified as traumatic: Secondary | ICD-10-CM | POA: Diagnosis not present

## 2023-03-14 DIAGNOSIS — R531 Weakness: Secondary | ICD-10-CM | POA: Diagnosis not present

## 2023-03-19 DIAGNOSIS — R531 Weakness: Secondary | ICD-10-CM | POA: Diagnosis not present

## 2023-03-19 DIAGNOSIS — M75111 Incomplete rotator cuff tear or rupture of right shoulder, not specified as traumatic: Secondary | ICD-10-CM | POA: Diagnosis not present

## 2023-03-27 DIAGNOSIS — R531 Weakness: Secondary | ICD-10-CM | POA: Diagnosis not present

## 2023-03-27 DIAGNOSIS — M75111 Incomplete rotator cuff tear or rupture of right shoulder, not specified as traumatic: Secondary | ICD-10-CM | POA: Diagnosis not present

## 2023-04-03 DIAGNOSIS — R531 Weakness: Secondary | ICD-10-CM | POA: Diagnosis not present

## 2023-04-03 DIAGNOSIS — M75111 Incomplete rotator cuff tear or rupture of right shoulder, not specified as traumatic: Secondary | ICD-10-CM | POA: Diagnosis not present

## 2023-04-23 DIAGNOSIS — M25511 Pain in right shoulder: Secondary | ICD-10-CM | POA: Diagnosis not present

## 2023-04-24 DIAGNOSIS — M75111 Incomplete rotator cuff tear or rupture of right shoulder, not specified as traumatic: Secondary | ICD-10-CM | POA: Diagnosis not present

## 2023-04-24 DIAGNOSIS — R531 Weakness: Secondary | ICD-10-CM | POA: Diagnosis not present

## 2023-05-03 DIAGNOSIS — M75111 Incomplete rotator cuff tear or rupture of right shoulder, not specified as traumatic: Secondary | ICD-10-CM | POA: Diagnosis not present

## 2023-05-03 DIAGNOSIS — R531 Weakness: Secondary | ICD-10-CM | POA: Diagnosis not present

## 2023-05-08 DIAGNOSIS — R531 Weakness: Secondary | ICD-10-CM | POA: Diagnosis not present

## 2023-05-08 DIAGNOSIS — M75111 Incomplete rotator cuff tear or rupture of right shoulder, not specified as traumatic: Secondary | ICD-10-CM | POA: Diagnosis not present

## 2023-05-15 DIAGNOSIS — R531 Weakness: Secondary | ICD-10-CM | POA: Diagnosis not present

## 2023-05-15 DIAGNOSIS — M75111 Incomplete rotator cuff tear or rupture of right shoulder, not specified as traumatic: Secondary | ICD-10-CM | POA: Diagnosis not present

## 2023-05-17 DIAGNOSIS — R531 Weakness: Secondary | ICD-10-CM | POA: Diagnosis not present

## 2023-05-17 DIAGNOSIS — M75111 Incomplete rotator cuff tear or rupture of right shoulder, not specified as traumatic: Secondary | ICD-10-CM | POA: Diagnosis not present

## 2023-05-29 DIAGNOSIS — M67911 Unspecified disorder of synovium and tendon, right shoulder: Secondary | ICD-10-CM | POA: Diagnosis not present

## 2023-06-05 DIAGNOSIS — Z125 Encounter for screening for malignant neoplasm of prostate: Secondary | ICD-10-CM | POA: Diagnosis not present

## 2023-06-05 DIAGNOSIS — I10 Essential (primary) hypertension: Secondary | ICD-10-CM | POA: Diagnosis not present

## 2023-06-05 DIAGNOSIS — E7800 Pure hypercholesterolemia, unspecified: Secondary | ICD-10-CM | POA: Diagnosis not present

## 2023-06-05 DIAGNOSIS — R7303 Prediabetes: Secondary | ICD-10-CM | POA: Diagnosis not present

## 2023-06-05 DIAGNOSIS — Z0000 Encounter for general adult medical examination without abnormal findings: Secondary | ICD-10-CM | POA: Diagnosis not present

## 2023-07-10 DIAGNOSIS — M25522 Pain in left elbow: Secondary | ICD-10-CM | POA: Diagnosis not present

## 2023-07-10 DIAGNOSIS — M67911 Unspecified disorder of synovium and tendon, right shoulder: Secondary | ICD-10-CM | POA: Diagnosis not present

## 2023-08-09 DIAGNOSIS — M7501 Adhesive capsulitis of right shoulder: Secondary | ICD-10-CM | POA: Diagnosis not present

## 2023-09-11 DIAGNOSIS — M67911 Unspecified disorder of synovium and tendon, right shoulder: Secondary | ICD-10-CM | POA: Diagnosis not present

## 2023-11-18 DIAGNOSIS — R0781 Pleurodynia: Secondary | ICD-10-CM | POA: Diagnosis not present
